# Patient Record
Sex: Male | Born: 1945 | Race: Black or African American | Hispanic: No | Smoking: Current some day smoker
Health system: Southern US, Community
[De-identification: ages and names within clinical notes are randomized; demographics above are authoritative.]

## PROBLEM LIST (undated history)

## (undated) DIAGNOSIS — I10 Essential (primary) hypertension: Secondary | ICD-10-CM

---

## 2015-12-21 ENCOUNTER — Emergency Department (HOSPITAL_COMMUNITY): Payer: Medicare Other

## 2015-12-21 ENCOUNTER — Encounter (HOSPITAL_COMMUNITY): Payer: Self-pay

## 2015-12-21 ENCOUNTER — Emergency Department (HOSPITAL_COMMUNITY)
Admission: EM | Admit: 2015-12-21 | Discharge: 2015-12-21 | Disposition: A | Discharge status: Home / Self Care | Payer: Medicare Other | Attending: Emergency Medicine | Admitting: Emergency Medicine

## 2015-12-21 DIAGNOSIS — I10 Essential (primary) hypertension: Secondary | ICD-10-CM

## 2015-12-21 DIAGNOSIS — F17200 Nicotine dependence, unspecified, uncomplicated: Secondary | ICD-10-CM | POA: Insufficient documentation

## 2015-12-21 DIAGNOSIS — R51 Headache: Secondary | ICD-10-CM | POA: Diagnosis not present

## 2015-12-21 DIAGNOSIS — R519 Headache, unspecified: Secondary | ICD-10-CM

## 2015-12-21 HISTORY — DX: Essential (primary) hypertension: I10

## 2015-12-21 LAB — BASIC METABOLIC PANEL
Anion gap: 14 (ref 5–15)
BUN: 29 mg/dL — ABNORMAL HIGH (ref 6–20)
CALCIUM: 9.9 mg/dL (ref 8.9–10.3)
CHLORIDE: 100 mmol/L — AB (ref 101–111)
CO2: 24 mmol/L (ref 22–32)
CREATININE: 1.48 mg/dL — AB (ref 0.61–1.24)
GFR calc non Af Amer: 46 mL/min — ABNORMAL LOW (ref >60)
GFR, EST AFRICAN AMERICAN: 53 mL/min — AB (ref >60)
Glucose, Bld: 99 mg/dL (ref 65–99)
Potassium: 4.6 mmol/L (ref 3.5–5.1)
SODIUM: 138 mmol/L (ref 135–145)

## 2015-12-21 LAB — CBC
HCT: 46.2 % (ref 39.0–52.0)
HEMOGLOBIN: 15.1 g/dL (ref 13.0–17.0)
MCH: 29.1 pg (ref 26.0–34.0)
MCHC: 32.7 g/dL (ref 30.0–36.0)
MCV: 89 fL (ref 78.0–100.0)
Platelets: 274 10*3/uL (ref 150–400)
RBC: 5.19 MIL/uL (ref 4.22–5.81)
RDW: 15.4 % (ref 11.5–15.5)
WBC: 7.3 10*3/uL (ref 4.0–10.5)

## 2015-12-21 MED ORDER — HYDROCHLOROTHIAZIDE 25 MG PO TABS: 25.0000 mg | ORAL_TABLET | Freq: Every day | ORAL | Status: DC

## 2015-12-21 MED ORDER — HYDROCHLOROTHIAZIDE 25 MG PO TABS: 25.0000 mg | ORAL_TABLET | Freq: Every day | ORAL | 0 refills | Status: AC

## 2015-12-21 MED ORDER — HYDROCHLOROTHIAZIDE 25 MG PO TABS
25.0000 mg | ORAL_TABLET | Freq: Once | ORAL | Status: AC
  Administered 2015-12-21: 25 mg via ORAL
  Filled 2015-12-21: qty 1

## 2015-12-21 NOTE — ED Triage Notes (Signed)
Pt here with headache, blurry vision and back pain, onset 3 days ago. Denies head injury, n/v. Denies hx of HAs. A&ox4, ambulatory,  NAD, strong equal hand grasps bilaterally. Denies numbness, tingling or weakness anywhere.

## 2015-12-21 NOTE — ED Provider Notes (Signed)
[facility]PT Provider Note   CSN: [id number] Arrival date & time: 12/21/15  1158     History   Chief Complaint Chief Complaint  Patient presents with  . Headache    HPI Max Cross is a 70 y.o. male.  HPI 70 y.o. male with a hx of HTN, presents to the Emergency Department today complaining of headache x 3 days ago. Noted that the headache occurring while sitting on the couch. States that the headache was circumferential and lasted for 2 days straight. Rated pain 6/10. No relief with a medication similar to Nyquil. Pt states he took the medication so he could sleep. No N/V. Does endorse blurred vision at the time that is somewhat improved today. Denies headache currently. No CP/SOB/ABD pain. Does have bilateral pain in trapezius. No trauma or lifting injury that he is aware. No fevers. No numbness/tingling down extremities. NO hx CVA. Only hx is HTN, which he did not take his BP meds today. He is on 25mg  HCTZ. No other symptoms noted.     Past Medical History:  Diagnosis Date  . Hypertension    There are no active problems to display for this patient.   History reviewed. No pertinent surgical history.   Home Medications    Prior to Admission medications   Not on File    Family History No family history on file.  Social History Social History  Substance Use Topics  . Smoking status: Current Some Day Smoker  . Smokeless tobacco: Never Used  . Alcohol use No     Allergies   Review of patient's allergies indicates not on file.   Review of Systems Review of Systems ROS reviewed and all are negative for acute change except as noted in the HPI.  Physical Exam Updated Vital Signs BP 157/90 (BP Location: Right Arm)   Pulse 97   Temp 98.2 F (36.8 C) (Oral)   Resp 16   SpO2 100%   Physical Exam  Constitutional: He is oriented to person, place, and time. Vital signs are normal. He appears well-developed and well-nourished.  HENT:  Head: Normocephalic  and atraumatic. Head is without raccoon's eyes and without Battle's sign.  Right Ear: Hearing normal.  Left Ear: Hearing normal.  Eyes: Conjunctivae and EOM are normal. Pupils are equal, round, and reactive to light.  Neck: Trachea normal, normal range of motion, full passive range of motion without pain and phonation normal. Neck supple. No tracheal tenderness, no spinous process tenderness and no muscular tenderness present. No neck rigidity. No edema, no erythema and normal range of motion present.  Cardiovascular: Normal rate, regular rhythm, normal heart sounds, intact distal pulses and normal pulses.   Distal pulses appreciated x 4 extremities  Pulmonary/Chest: Effort normal and breath sounds normal. No respiratory distress. He has no wheezes. He has no rales. He exhibits no tenderness.  Abdominal: Soft.  Musculoskeletal: Normal range of motion.  TTP bilateral trapezius musculature. ROM intact. No erythema. No ecchymosis.  Neurological: He is alert and oriented to person, place, and time. He has normal strength and normal reflexes. No cranial nerve deficit or sensory deficit.  Cranial Nerves:  II: Pupils equal, round, reactive to light III,IV, VI: ptosis not present, extra-ocular motions intact bilaterally  V,VII: smile symmetric, facial light touch sensation equal VIII: hearing grossly normal bilaterally  IX,X: midline uvula rise  XI: bilateral shoulder shrug equal and strong XII: midline tongue extension  Skin: Skin is warm and dry.  Psychiatric: He has a normal  mood and affect. His speech is normal and behavior is normal. Thought content normal.  Nursing note and vitals reviewed.  ED Treatments / Results  Labs (all labs ordered are listed, but only abnormal results are displayed) Labs Reviewed  BASIC METABOLIC PANEL - Abnormal; Notable for the following:       Result Value   Chloride 100 (*)    BUN 29 (*)    Creatinine, Ser 1.48 (*)    GFR calc non Af Amer 46 (*)    GFR  calc Af Amer 53 (*)    All other components within normal limits  CBC    EKG  EKG Interpretation None      Radiology Ct Head Wo Contrast  Result Date: 12/21/2015 CLINICAL DATA:  Headache for 2 weeks. EXAM: CT HEAD WITHOUT CONTRAST TECHNIQUE: Contiguous axial images were obtained from the base of the skull through the vertex without intravenous contrast. COMPARISON:  None. FINDINGS: Brain: There is no evidence for acute hemorrhage, hydrocephalus, mass lesion, or abnormal extra-axial fluid collection. No definite CT evidence for acute infarction. 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 Vascular: Atherosclerotic calcification is visualized in the carotid arteries. No dense MCA sign. Major dural sinuses are unremarkable. Skull: No evidence for skull fracture. Sinuses/Orbits: The visualized paranasal sinuses and mastoid air cells are clear. Visualized portions of the globes and intraorbital fat are unremarkable. Other: N/A IMPRESSION: No acute intracranial abnormality. Electronically Signed   By: Kennith Center M.D.   On: 12/21/2015 16:37    Procedures Procedures (including critical care time)  Medications Ordered in ED Medications - No data to display   Initial Impression / Assessment and Plan / ED Course  I have reviewed the triage vital signs and the nursing notes.  Pertinent labs & imaging results that were available during my care of the patient were reviewed by me and considered in my medical decision making (see chart for details).  Clinical Course   Final Clinical Impressions(s) / ED Diagnoses  I have reviewed and evaluated the relevant laboratory values I have reviewed and evaluated the relevant imaging studies. I have reviewed the relevant previous healthcare records.I obtained HPI from historian. Patient discussed with supervising physician  ED  Course:  Assessment: Pt is a 69yM with hx HTn who presents with headache x 2 days that has since resolved. Associated blurred vision, which is improving. No N/V. No numbness/tingling. No hx CVA. On exam, pt in NAD. Nontoxic/nonseptic appearing. VSS. Afebrile. Lungs CTA. Heart RRR. CN evaluated and unremarakble. CBC/BMP unremarkable. CT Head unremarkable. Possibly related to HTN. Given home BP meds in ED. Discussed with supervising physician. Plan is to DC Home with follow up to PCP. At time of discharge, Patient is in no acute distress. Vital Signs are stable. Patient is able to ambulate. Patient able to tolerate PO.   Refilled pt HCTZ medication as he ran out x 1 week ago.   Disposition/Plan:  DC Home Additional Verbal discharge instructions given and discussed with patient.  Pt Instructed to f/u with PCP in the next week for evaluation and treatment of symptoms. Return precautions given Pt acknowledges and agrees with plan  Supervising Physician Raeford Razor, MD   Final diagnoses:  Essential hypertension  Nonintractable headache, unspecified chronicity pattern, unspecified headache type    New Prescriptions New Prescriptions   No medications on file       Audry Pili, PA-C 12/21/15 1645  Raeford Razor, MD 12/21/15 [phone]

## 2015-12-21 NOTE — ED Notes (Signed)
Pt A&OX4, pt states his daughter will be driving him home.

## 2015-12-21 NOTE — Discharge Instructions (Signed)
Please read and follow all provided instructions.  Your diagnoses today include:  1. Essential hypertension   2. Nonintractable headache, unspecified chronicity pattern, unspecified headache type    Tests performed today include: Vital signs. See below for your results today.   Medications prescribed:  Take as prescribed   Home care instructions:  Follow any educational materials contained in this packet.  Follow-up instructions: Please follow-up with your primary care provider for further evaluation of symptoms and treatment   Return instructions:  Please return to the Emergency Department if you do not get better, if you get worse, or new symptoms OR  - Fever (temperature greater than 101.49F)  - Bleeding that does not stop with holding pressure to the area    -Severe pain (please note that you may be more sore the day after your accident)  - Chest Pain  - Difficulty breathing  - Severe nausea or vomiting  - Inability to tolerate food and liquids  - Passing out  - Skin becoming red around your wounds  - Change in mental status (confusion or lethargy)  - New numbness or weakness    Please return if you have any other emergent concerns.  Additional Information:  Your vital signs today were: BP 175/100 (BP Location: Right Arm)    Pulse 85    Temp 98.3 F (36.8 C) (Oral)    Resp 16    SpO2 100%  If your blood pressure (BP) was elevated above 135/85 this visit, please have this repeated by your doctor within one month. ---------------

## 2016-08-30 ENCOUNTER — Emergency Department (HOSPITAL_COMMUNITY)
Admission: EM | Admit: 2016-08-30 | Discharge: 2016-08-30 | Disposition: A | Discharge status: Home / Self Care | Payer: Medicare Other | Attending: Emergency Medicine | Admitting: Emergency Medicine

## 2016-08-30 ENCOUNTER — Encounter (HOSPITAL_COMMUNITY): Payer: Self-pay | Admitting: Emergency Medicine

## 2016-08-30 DIAGNOSIS — I10 Essential (primary) hypertension: Secondary | ICD-10-CM | POA: Insufficient documentation

## 2016-08-30 DIAGNOSIS — R21 Rash and other nonspecific skin eruption: Secondary | ICD-10-CM | POA: Diagnosis present

## 2016-08-30 DIAGNOSIS — F17200 Nicotine dependence, unspecified, uncomplicated: Secondary | ICD-10-CM | POA: Diagnosis not present

## 2016-08-30 MED ORDER — PREDNISONE 20 MG PO TABS: ORAL_TABLET | ORAL | 0 refills | Status: AC

## 2016-08-30 NOTE — ED Triage Notes (Signed)
Pt. Stated, I started having this rash on my arms that itches and burns. For over a week. . Pt. Take HCTZ

## 2016-08-30 NOTE — Discharge Instructions (Signed)
Please read and follow all provided instructions.  Your diagnoses today include:  1. Rash and nonspecific skin eruption     Tests performed today include:  Vital signs. See below for your results today.   Medications prescribed:   Prednisone - steroid medicine   It is best to take this medication in the morning to prevent sleeping problems. If you are diabetic, monitor your blood sugar closely and stop taking Prednisone if blood sugar is over 300. Take with food to prevent stomach upset.   Take any prescribed medications only as directed.  Home care instructions:   Follow any educational materials contained in this packet  Wear skin protection such as clothing or sun screen  Follow-up instructions: Please follow-up with your primary care provider in the next 7 days if rash is not improved.   Return instructions:   Please return to the Emergency Department if you experience worsening symptoms.   Call 9-1-1 immediately if you have an allergic reaction that involves your lips, mouth, throat or if you have any difficulty breathing. This is a life-threatening emergency.   Please return if you have any other emergent concerns.  Additional Information:  Your vital signs today were: BP (!) 164/103 (BP Location: Right Arm)    Pulse 78    Temp 98.4 F (36.9 C) (Oral)    Resp 16    Ht 5\' 6"  (1.676 m)    Wt 146 lb (66.2 kg)    SpO2 99%    BMI 23.57 kg/m  If your blood pressure (BP) was elevated above 135/85 this visit, please have this repeated by your doctor within one month. --------------

## 2016-08-30 NOTE — ED Provider Notes (Signed)
  Face-to-face evaluation   History: Complaints of rash for several days.  It itches.  It seems to be aggravated by being in the sun.  Physical exam: Alert, cooperative.  No respiratory distress.  Red raised rash, bilateral antecubital spaces, and dorsal forearms.  Nonspecific appearance.  Medical screening examination/treatment/procedure(s) were conducted as a shared visit with non-physician practitioner(s) and myself.  I personally evaluated the patient during the encounter   Mancel BaleWentz, Taden Witter, MD 08/30/16 [phone]

## 2016-08-30 NOTE — ED Provider Notes (Signed)
[facility] Provider Note   CSN: [id number] Arrival date & time: 08/30/16  0715     History   Chief Complaint Chief Complaint  Patient presents with  . Rash    HPI Max Cross is a 71 y.o. male.  Patient with history of hypertension on HCTZ -- presents with complaint of itchy rash that was originally around his neckline and also his bilateral forearms. Denies other areas. No lesions in the mouth or of the eyes. No difficulty breathing, lightheadedness, vomiting. Patient applied "a cream" to the arms which did not help. The rash around his neck is improved. Patient reports going outside but not for prolonged periods of time. He denies any new food or medication exposures. Denies other new skin exposures. No tick bites or exposures to poisonous plants. He does report discontinuing his HCTZ for several days after the rash began. The onset of this condition was acute. The course is constant. Aggravating factors: none. Alleviating factors: none.        Past Medical History:  Diagnosis Date  . Hypertension     There are no active problems to display for this patient.   History reviewed. No pertinent surgical history.     Home Medications    Prior to Admission medications   Medication Sig Start Date End Date Taking? Authorizing Provider  hydrochlorothiazide (HYDRODIURIL) 25 MG tablet Take 1 tablet (25 mg total) by mouth daily. 12/21/15   Audry Pili, PA-C    Family History No family history on file.  Social History Social History  Substance Use Topics  . Smoking status: Current Some Day Smoker  . Smokeless tobacco: Never Used  . Alcohol use No     Allergies   Patient has no allergy information on record.   Review of Systems Review of Systems  Constitutional: Negative for fever.  HENT: Negative for facial swelling and trouble swallowing.   Eyes: Negative for pain, discharge, redness and itching.  Respiratory: Negative for shortness of breath,  wheezing and stridor.   Cardiovascular: Negative for chest pain.  Gastrointestinal: Negative for nausea and vomiting.  Musculoskeletal: Negative for myalgias.  Skin: Positive for rash.  Neurological: Negative for light-headedness.  Psychiatric/Behavioral: Negative for confusion.     Physical Exam Updated Vital Signs BP (!) 187/111 (BP Location: Right Arm)   Pulse (!) 122   Temp 99.2 F (37.3 C) (Oral)   Resp 16   Ht 5\' 6"  (1.676 m)   Wt 146 lb (66.2 kg)   SpO2 99%   BMI 23.57 kg/m   Physical Exam  Constitutional: He appears well-developed and well-nourished.  HENT:  Head: Normocephalic and atraumatic.  No intraoral lesions.  Eyes: Conjunctivae are normal. Right eye exhibits no discharge. Left eye exhibits no discharge.  No conjunctival erythema.  Neck: Normal range of motion. Neck supple.  Cardiovascular: Normal rate, regular rhythm and normal heart sounds.   Pulmonary/Chest: Effort normal and breath sounds normal.  Abdominal: Soft. There is no tenderness.  Neurological: He is alert.  Skin: Skin is warm and dry. There is erythema.  Nonspecific maculopapular rash noted to the bilateral forearms. Patient reports resolved rash around the neckline.  Psychiatric: He has a normal mood and affect.  Nursing note and vitals reviewed.    ED Treatments / Results  Labs (all labs ordered are listed, but only abnormal results are displayed) Labs Reviewed - No data to display  EKG  EKG Interpretation None       Radiology No results found.  Procedures Procedures (including critical care time)  Medications Ordered in ED Medications - No data to display   Initial Impression / Assessment and Plan / ED Course  I have reviewed the triage vital signs and the nursing notes.  Pertinent labs & imaging results that were available during my care of the patient were reviewed by me and considered in my medical decision making (see chart for details).     Patient seen and  examined. D/w and seen by Dr. Effie ShyWentz.   Vital signs reviewed and are as follows: BP (!) 187/111 (BP Location: Right Arm)   Pulse (!) 122   Temp 99.2 F (37.3 C) (Oral)   Resp 16   Ht 5\' 6"  (1.676 m)   Wt 146 lb (66.2 kg)   SpO2 99%   BMI 23.57 kg/m   Repeat vitals improved.   BP (!) 164/103 (BP Location: Right Arm)   Pulse 78   Temp 98.4 F (36.9 C) (Oral)   Resp 16   Ht 5\' 6"  (1.676 m)   Wt 146 lb (66.2 kg)   SpO2 99%   BMI 23.57 kg/m   Will d/c to home with prednisone taper. Discussed skin protection from the sun. Encouraged return if symptoms worsen, PCP follow-up if not improving.  Final Clinical Impressions(s) / ED Diagnoses   Final diagnoses:  Rash and nonspecific skin eruption   Patient with rash, maculopapular, itchy, noted to sun exposed areas. Patient is on HCTZ which may make him photosensitive. Also potentially contact dermatitis of some sort. No contraindications to course of tapered steroids. No signs of anaphylaxis or other systemic symptoms. No eye or mucosal involvement.  New Prescriptions New Prescriptions   PREDNISONE (DELTASONE) 20 MG TABLET    3 Tabs PO Days 1-3, then 2 tabs PO Days 4-6, then 1 tab PO Day 7-9, then Half Tab PO Day 10-12     Renne CriglerGeiple, Gurnie Duris, PA-C 08/30/16 0800    Mancel BaleWentz, Elliott, MD 08/30/16 [phone]

## 2016-08-30 NOTE — ED Notes (Signed)
Declined W/C at D/C and was escorted to lobby by RN. 

## 2016-09-20 ENCOUNTER — Encounter (HOSPITAL_COMMUNITY): Payer: Self-pay | Admitting: Emergency Medicine

## 2016-09-20 ENCOUNTER — Emergency Department (HOSPITAL_COMMUNITY)
Admission: EM | Admit: 2016-09-20 | Discharge: 2016-09-20 | Disposition: A | Discharge status: Home / Self Care | Payer: Medicare Other | Attending: Emergency Medicine | Admitting: Emergency Medicine

## 2016-09-20 DIAGNOSIS — R21 Rash and other nonspecific skin eruption: Secondary | ICD-10-CM | POA: Diagnosis not present

## 2016-09-20 DIAGNOSIS — I10 Essential (primary) hypertension: Secondary | ICD-10-CM | POA: Diagnosis not present

## 2016-09-20 DIAGNOSIS — F17200 Nicotine dependence, unspecified, uncomplicated: Secondary | ICD-10-CM | POA: Insufficient documentation

## 2016-09-20 MED ORDER — PREDNISONE 10 MG (21) PO TBPK: ORAL_TABLET | Freq: Every day | ORAL | 0 refills | Status: AC

## 2016-09-20 MED ORDER — PREDNISONE 20 MG PO TABS
60.0000 mg | ORAL_TABLET | Freq: Once | ORAL | Status: AC
  Administered 2016-09-20: 60 mg via ORAL
  Filled 2016-09-20: qty 3

## 2016-09-20 NOTE — Discharge Instructions (Signed)
Call your doctor tomorrow to schedule a follow-up visit 

## 2016-09-20 NOTE — ED Provider Notes (Addendum)
[facility] Provider Note   CSN: [id number] Arrival date & time: 09/20/16  0907     History   Chief Complaint Chief Complaint  Patient presents with  . Rash  . Nausea    HPI [name] is a 71 y.o. male.  71 year old male presents with rash to both arms. He denies any chemical exposures. Had been on prednisone for similar symptoms which improved his symptoms don't see complete the course medications the rash returned. Rash is pruritic hive-like. Denies any oral involvement. No dyspnea. No trouble swallowing. Has been taking antihistamine which hasn't helped.      Past Medical History:  Diagnosis Date  . Hypertension     There are no active problems to display for this patient.   History reviewed. No pertinent surgical history.     Home Medications    Prior to Admission medications   Medication Sig Start Date End Date Taking? Authorizing Provider  hydrochlorothiazide (HYDRODIURIL) 25 MG tablet Take 1 tablet (25 mg total) by mouth daily. 12/21/15   Audry Pili, PA-C  predniSONE (DELTASONE) 20 MG tablet 3 Tabs PO Days 1-3, then 2 tabs PO Days 4-6, then 1 tab PO Day 7-9, then Half Tab PO Day 10-12 08/30/16   Renne Crigler, PA-C    Family History History reviewed. No pertinent family history.  Social History Social History  Substance Use Topics  . Smoking status: Current Some Day Smoker  . Smokeless tobacco: Never Used  . Alcohol use No     Allergies   Patient has no known allergies.   Review of Systems Review of Systems  All other systems reviewed and are negative.    Physical Exam Updated Vital Signs BP (!) 176/98   Pulse 94   Temp 98.4 F (36.9 C) (Oral)   Resp 17   SpO2 100%   Physical Exam  Constitutional: He is oriented to person, place, and time. He appears well-developed and well-nourished.  Non-toxic appearance. No distress.  HENT:  Head: Normocephalic and atraumatic.  Eyes: Conjunctivae, EOM and lids are normal. Pupils  are equal, round, and reactive to light.  Neck: Normal range of motion. Neck supple. No tracheal deviation present. No thyroid mass present.  Cardiovascular: Normal rate, regular rhythm and normal heart sounds.  Exam reveals no gallop.   No murmur heard. Pulmonary/Chest: Effort normal and breath sounds normal. No stridor. No respiratory distress. He has no decreased breath sounds. He has no wheezes. He has no rhonchi. He has no rales.  Abdominal: Soft. Normal appearance and bowel sounds are normal. He exhibits no distension. There is no tenderness. There is no rebound and no CVA tenderness.  Musculoskeletal: Normal range of motion. He exhibits no edema or tenderness.  Neurological: He is alert and oriented to person, place, and time. He has normal strength. No cranial nerve deficit or sensory deficit. GCS eye subscore is 4. GCS verbal subscore is 5. GCS motor subscore is 6.  Skin: Skin is warm and dry. Rash noted. Rash is urticarial.  Psychiatric: He has a normal mood and affect. His speech is normal and behavior is normal.  Nursing note and vitals reviewed.    ED Treatments / Results  Labs (all labs ordered are listed, but only abnormal results are displayed) Labs Reviewed - No data to display  EKG  EKG Interpretation None       Radiology No results found.  Procedures Procedures (including critical care time)  Medications Ordered in ED Medications  predniSONE (DELTASONE) tablet  60 mg (not administered)     Initial Impression / Assessment and Plan / ED Course  I have reviewed the triage vital signs and the nursing notes.  Pertinent labs & imaging results that were available during my care of the patient were reviewed by me and considered in my medical decision making (see chart for details).     Patient be treated with prednisone here. Will be given prednisone taper and instructed to use Benadryl as needed. He'll follow this Dr. next week for a referral to an  allergist  Final Clinical Impressions(s) / ED Diagnoses   Final diagnoses:  None    New Prescriptions New Prescriptions   No medications on file     Lorre NickAllen, Sesar Madewell, MD 09/20/16 1010    Lorre NickAllen, Jelina Paulsen, MD 09/20/16 1010

## 2016-09-20 NOTE — ED Triage Notes (Signed)
Pt here for itchy rash to arms and nausea x 5 days; pt sts not able to eat and having generalized weakness

## 2016-09-20 NOTE — ED Notes (Signed)
Pt has red raised type rash on both arms-- took steroids and was better-- [facility] on C[facility], rash came back.

## 2017-01-07 ENCOUNTER — Encounter (HOSPITAL_COMMUNITY): Payer: Self-pay

## 2017-01-07 ENCOUNTER — Emergency Department (HOSPITAL_COMMUNITY)
Admission: EM | Admit: 2017-01-07 | Discharge: 2017-01-07 | Disposition: A | Discharge status: Home / Self Care | Payer: Medicare Other | Attending: Emergency Medicine | Admitting: Emergency Medicine

## 2017-01-07 DIAGNOSIS — B354 Tinea corporis: Secondary | ICD-10-CM

## 2017-01-07 DIAGNOSIS — F17210 Nicotine dependence, cigarettes, uncomplicated: Secondary | ICD-10-CM | POA: Insufficient documentation

## 2017-01-07 DIAGNOSIS — Z79899 Other long term (current) drug therapy: Secondary | ICD-10-CM | POA: Diagnosis not present

## 2017-01-07 DIAGNOSIS — I10 Essential (primary) hypertension: Secondary | ICD-10-CM | POA: Diagnosis not present

## 2017-01-07 DIAGNOSIS — R21 Rash and other nonspecific skin eruption: Secondary | ICD-10-CM

## 2017-01-07 MED ORDER — HYDROXYZINE HCL 25 MG PO TABS: 50.0000 mg | ORAL_TABLET | Freq: Four times a day (QID) | ORAL | 0 refills | Status: AC | PRN

## 2017-01-07 MED ORDER — KETOCONAZOLE 2 % EX CREA: 1.0000 "application " | TOPICAL_CREAM | Freq: Every day | CUTANEOUS | 1 refills | Status: AC

## 2017-01-07 NOTE — ED Notes (Signed)
See PCP secondary assessment.

## 2017-01-07 NOTE — ED Triage Notes (Signed)
Pt presents to the ed for a rash x 3-4 months that is on his arms and legs, rash is itchy and red. States he has been seen for it multiple times and was told he needs a biopsy but never heard back and the itching is unbearable.
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
## 2017-03-14 IMAGING — CT CT HEAD W/O CM
4 series · 16 of 47 positions shown, 18 images · non-contrast
Comparison: None.

CLINICAL DATA: Headache for 2 weeks.

EXAM:
CT HEAD WITHOUT CONTRAST
TECHNIQUE: Contiguous axial images were obtained from the base of the skull
through the vertex without intravenous contrast.

[Series 2: head without · axial · non-contrast · 0.43mm/px · z∈[-57,+58]mm · 7 of 31 slices shown, 9 images]
[im 4/31  brain]
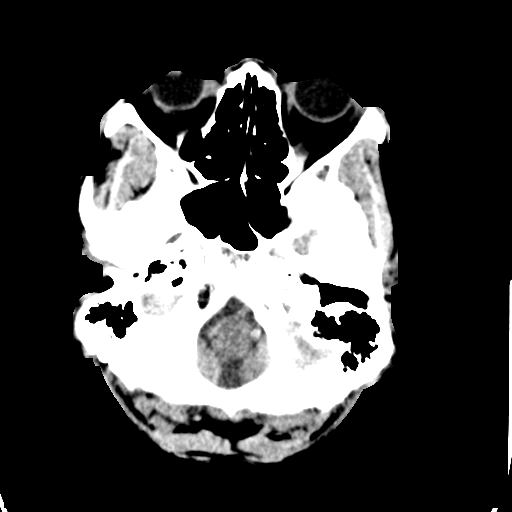
[im 4/31  bone]
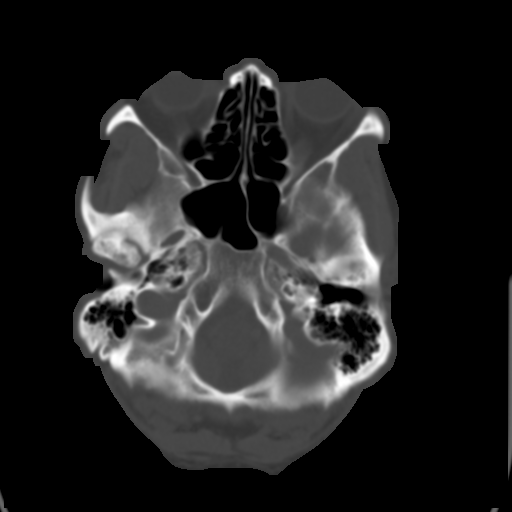
[im 8/31  brain]
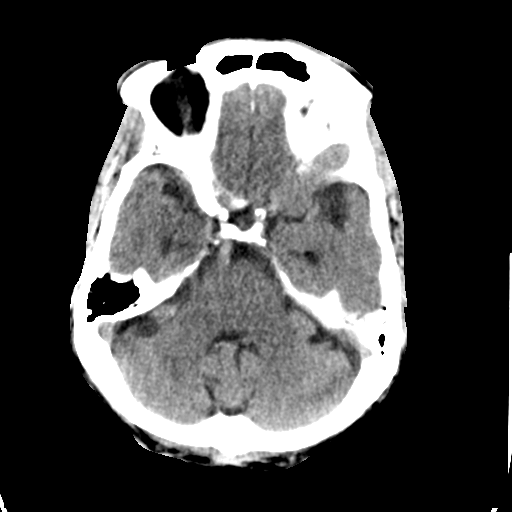
[im 12/31  brain]
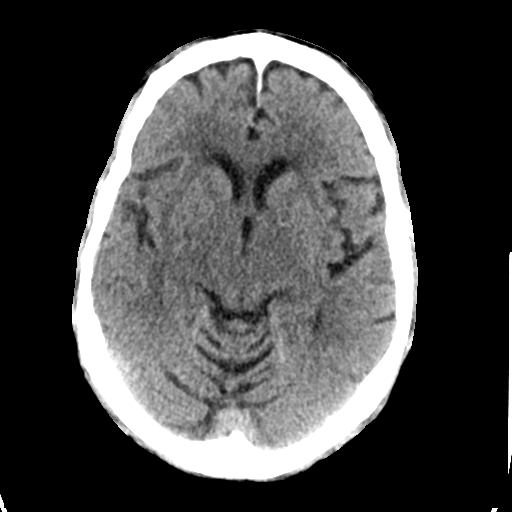
[im 16/31  brain]
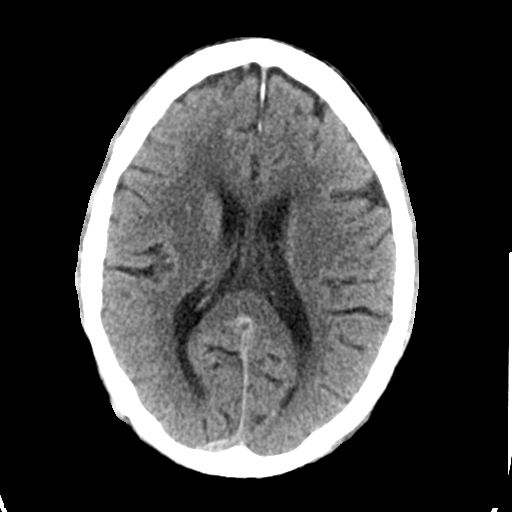
[im 19/31  brain]
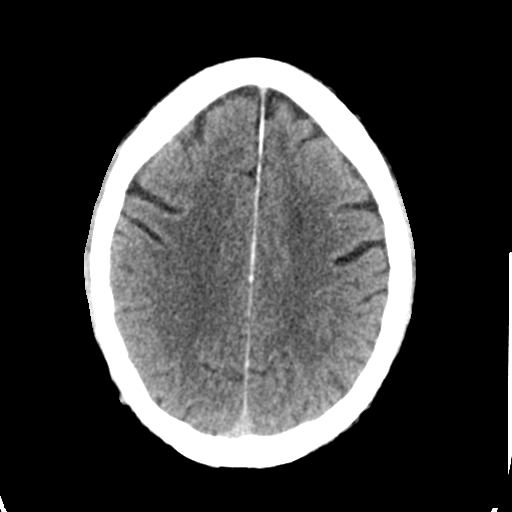
[im 19/31  bone]
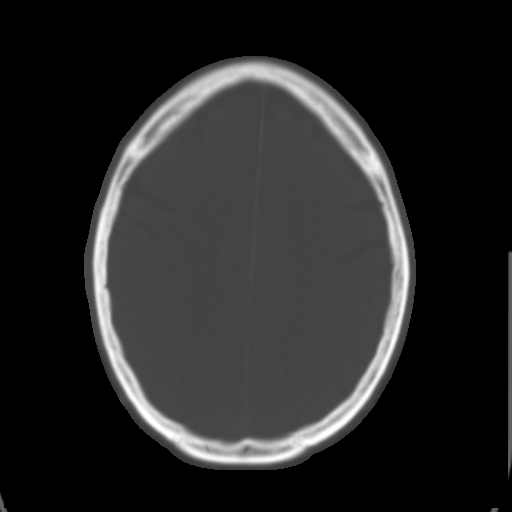
[im 23/31  brain]
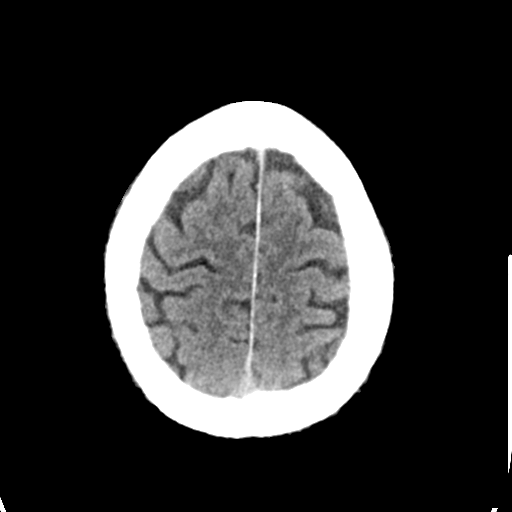
[im 27/31  brain]
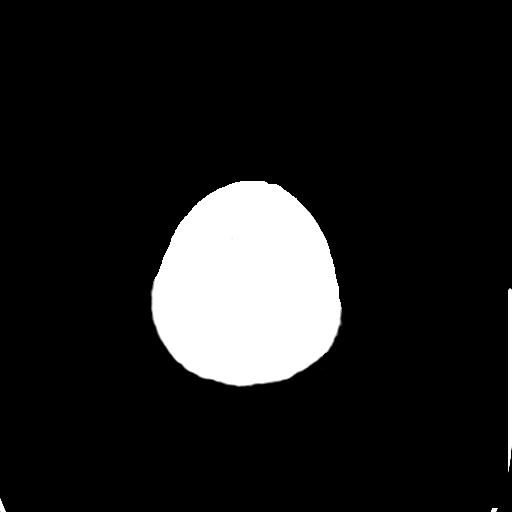

[Series 3: head bone · axial · 0.43mm/px · z∈[-58,-28]mm · 3 of 76 slices shown]
[im 8/76  bone]
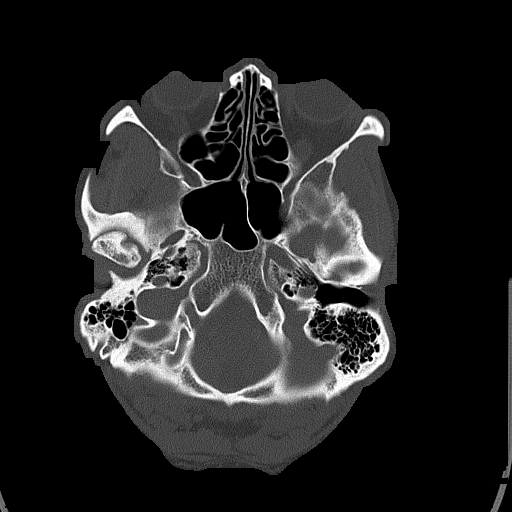
[im 16/76  bone]
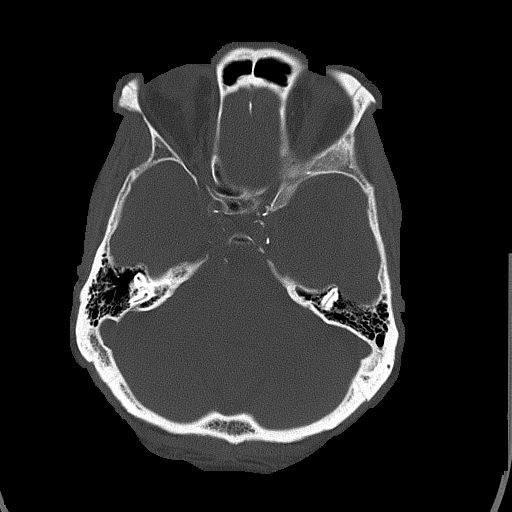
[im 23/76  bone]
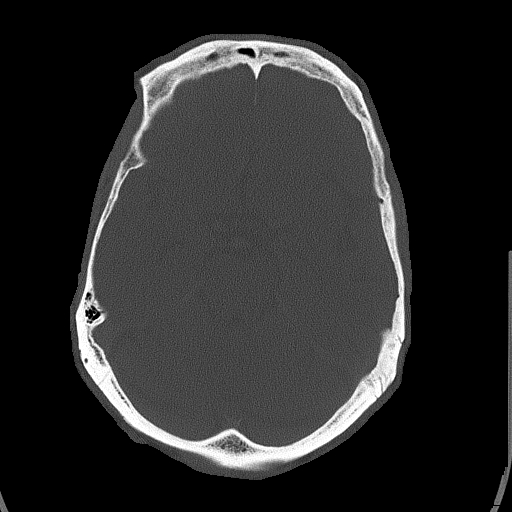

[Series 4: head without cor · coronal · non-contrast · 0.29mm/px · 3 of 67 slices shown]
[im 23/67  brain]
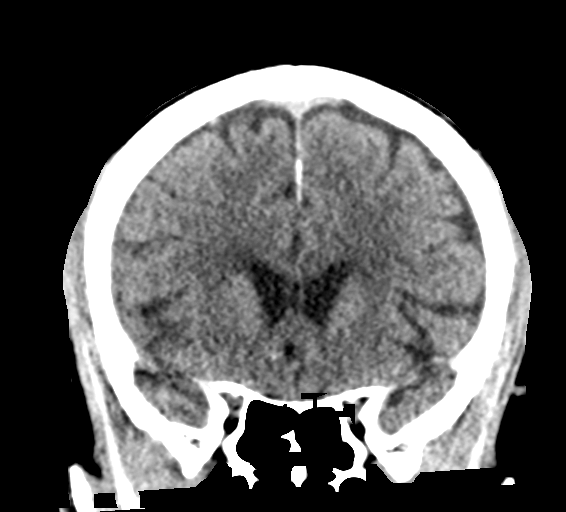
[im 30/67  brain]
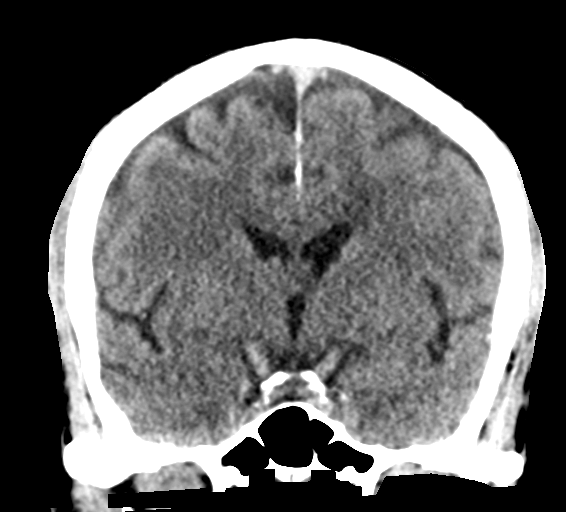
[im 37/67  brain]
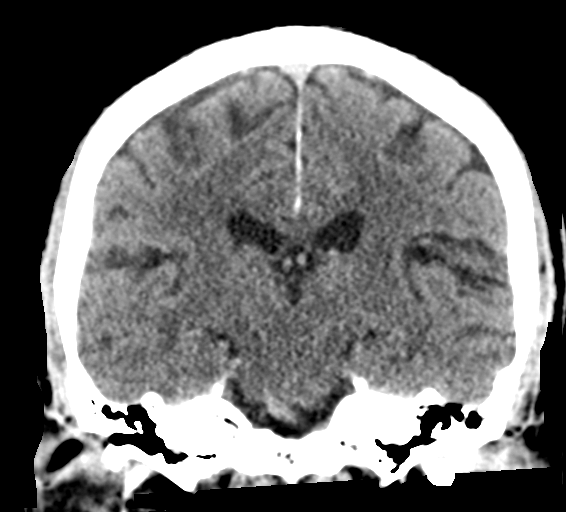

[Series 5: head without sag · sagittal · non-contrast · 0.29mm/px · 3 of 51 slices shown]
[im 17/51  brain]
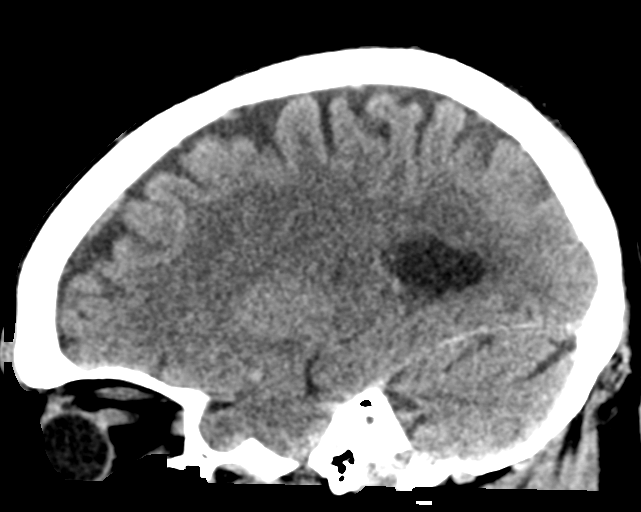
[im 26/51  brain]
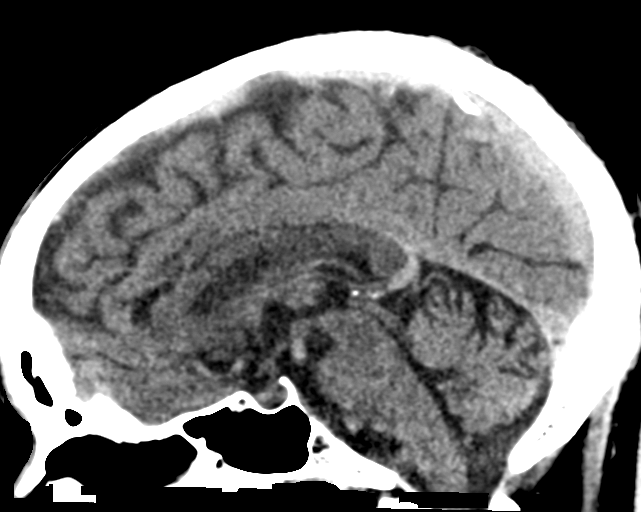
[im 34/51  brain]
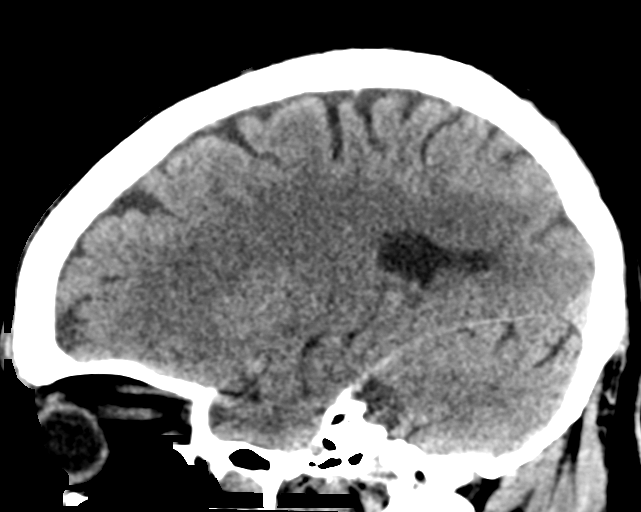

[16 of 47 positions shown; findings below may reference images not displayed]

FINDINGS: Brain: There is no evidence for acute hemorrhage, hydrocephalus,
mass lesion, or abnormal extra-axial fluid collection. No definite
CT evidence for acute infarction. Patchy low attenuation in the deep
hemispheric and periventricular white matter is nonspecific, but
likely reflects chronic microvascular ischemic demyelination.

Vascular: Atherosclerotic calcification is visualized in the carotid
arteries. No dense MCA sign. Major dural sinuses are unremarkable.

Skull: No evidence for skull fracture.

Sinuses/Orbits: The visualized paranasal sinuses and mastoid air
cells are clear. Visualized portions of the globes and intraorbital
fat are unremarkable.

Other: N/A
IMPRESSION: No acute intracranial abnormality.

## 2017-09-01 ENCOUNTER — Emergency Department (HOSPITAL_COMMUNITY): Payer: Medicare Other

## 2017-09-01 ENCOUNTER — Emergency Department (HOSPITAL_COMMUNITY)
Admission: EM | Admit: 2017-09-01 | Discharge: 2017-09-01 | Disposition: A | Discharge status: Home / Self Care | Payer: Medicare Other | Attending: Emergency Medicine | Admitting: Emergency Medicine

## 2017-09-01 ENCOUNTER — Encounter (HOSPITAL_COMMUNITY): Payer: Self-pay | Admitting: Emergency Medicine

## 2017-09-01 ENCOUNTER — Other Ambulatory Visit: Payer: Self-pay

## 2017-09-01 DIAGNOSIS — I10 Essential (primary) hypertension: Secondary | ICD-10-CM | POA: Insufficient documentation

## 2017-09-01 DIAGNOSIS — I491 Atrial premature depolarization: Secondary | ICD-10-CM | POA: Diagnosis not present

## 2017-09-01 DIAGNOSIS — R42 Dizziness and giddiness: Secondary | ICD-10-CM | POA: Diagnosis not present

## 2017-09-01 DIAGNOSIS — J029 Acute pharyngitis, unspecified: Secondary | ICD-10-CM | POA: Insufficient documentation

## 2017-09-01 DIAGNOSIS — F17210 Nicotine dependence, cigarettes, uncomplicated: Secondary | ICD-10-CM | POA: Insufficient documentation

## 2017-09-01 DIAGNOSIS — Z79899 Other long term (current) drug therapy: Secondary | ICD-10-CM | POA: Insufficient documentation

## 2017-09-01 LAB — CBG MONITORING, ED: Glucose-Capillary: 115 mg/dL — ABNORMAL HIGH (ref 65–99)

## 2017-09-01 LAB — I-STAT TROPONIN, ED: Troponin i, poc: 0.03 ng/mL (ref 0.00–0.08)

## 2017-09-01 LAB — CBC
HCT: 34.9 % — ABNORMAL LOW (ref 39.0–52.0)
HEMOGLOBIN: 10.7 g/dL — AB (ref 13.0–17.0)
MCH: 22.1 pg — ABNORMAL LOW (ref 26.0–34.0)
MCHC: 30.7 g/dL (ref 30.0–36.0)
MCV: 72.1 fL — ABNORMAL LOW (ref 78.0–100.0)
Platelets: 433 10*3/uL — ABNORMAL HIGH (ref 150–400)
RBC: 4.84 MIL/uL (ref 4.22–5.81)
RDW: 17.1 % — ABNORMAL HIGH (ref 11.5–15.5)
WBC: 5.5 10*3/uL (ref 4.0–10.5)

## 2017-09-01 LAB — BASIC METABOLIC PANEL
ANION GAP: 8 (ref 5–15)
BUN: 16 mg/dL (ref 6–20)
CALCIUM: 9.1 mg/dL (ref 8.9–10.3)
CO2: 28 mmol/L (ref 22–32)
Chloride: 102 mmol/L (ref 101–111)
Creatinine, Ser: 1.24 mg/dL (ref 0.61–1.24)
GFR, EST NON AFRICAN AMERICAN: 56 mL/min — AB (ref >60)
GLUCOSE: 120 mg/dL — AB (ref 65–99)
Potassium: 3.7 mmol/L (ref 3.5–5.1)
Sodium: 138 mmol/L (ref 135–145)

## 2017-09-01 LAB — URINALYSIS, ROUTINE W REFLEX MICROSCOPIC
Bacteria, UA: NONE SEEN
GLUCOSE, UA: NEGATIVE mg/dL
Hgb urine dipstick: NEGATIVE
KETONES UR: 5 mg/dL — AB
NITRITE: NEGATIVE
PH: 5 (ref 5.0–8.0)
Protein, ur: 100 mg/dL — AB
SPECIFIC GRAVITY, URINE: 1.026 (ref 1.005–1.030)

## 2017-09-01 LAB — GROUP A STREP BY PCR: Group A Strep by PCR: NOT DETECTED

## 2017-09-01 MED ORDER — DEXAMETHASONE SODIUM PHOSPHATE 10 MG/ML IJ SOLN
10.0000 mg | Freq: Once | INTRAMUSCULAR | Status: AC
  Administered 2017-09-01: 10 mg via INTRAMUSCULAR
  Filled 2017-09-01: qty 1

## 2017-09-01 MED ORDER — LIDOCAINE VISCOUS HCL 2 % MT SOLN
15.0000 mL | Freq: Once | OROMUCOSAL | Status: AC
  Administered 2017-09-01: 15 mL via OROMUCOSAL
  Filled 2017-09-01: qty 15

## 2017-09-01 MED ORDER — LIDOCAINE VISCOUS HCL 2 % MT SOLN: 15.0000 mL | OROMUCOSAL | 0 refills | Status: AC | PRN

## 2017-09-01 NOTE — ED Notes (Signed)
PT given cranberry juice to drink. Tolerating well

## 2017-09-01 NOTE — ED Triage Notes (Addendum)
Pt states he started taking Atorvastatin several days ago and since he has been taking it he has had a sore throat, feeling a lump in his throat with difficulty breathing at times accompanied with dizziness. Pt has not taken it since last Wednesday but is still dizzy and his throat is still sore, with a cough. Pt states he still has welts to his neck and upper body, legs and waist.

## 2017-09-01 NOTE — ED Provider Notes (Signed)
[facility] EMERGENCY DEPARTMENT Provider Note   CSN: [id number] Arrival date & time: 09/01/17  2130     History   Chief Complaint Chief Complaint  Patient presents with  . Allergic Reaction  . Sore Throat    HPI Max Cross is a 72 y.o. male with history of hypertension and anemia here for evaluation of sore throat and dizziness for 1 week.  Sore throat described as achy, sudden onset but gradually worsening x 1 week. Throat feels swollen. Pain is in bilateral sides equally. Aggravating factors: swallowing, talking, breathing in because air feels cold, mildly productive cough. Has tried cough drops without relief. No alleviating factors. Has been eating and drinking less due to pain. No sick contacts. No changes in voice, trismus, drooling. Started taking atorvastatin recently and feels sxs may be an allergic reaction to this medicine. No rash.   Dizziness onset 1 week ago along with ST. States he gets dizziness similar to this when he is sick. Has been eating and drinking less due to sore throat but feels he is urinating more frequently. Describes dizziness as "my vision gets foggy". Dizziness precipitated by standing up, movement and walking around.  Has to sit down and give himself a few minutes and dizziness eventually goes away. Dizziness last a few minutes. Associated with generalized fatigue. Denies associated HA, nausea, diaphoresis, CP, SOB, palpitations. Has not been vomiting or having diarrhea. Denies dysuria.  HPI  Past Medical History:  Diagnosis Date  . Hypertension     There are no active problems to display for this patient.   No past surgical history on file.      Home Medications    Prior to Admission medications   Medication Sig Start Date End Date Taking? Authorizing Provider  hydrochlorothiazide (HYDRODIURIL) 25 MG tablet Take 1 tablet (25 mg total) by mouth daily. 12/21/15   Audry Pili, PA-C  hydrOXYzine (ATARAX/VISTARIL) 25 MG  tablet Take 2 tablets (50 mg total) by mouth every 6 (six) hours as needed. 01/07/17   Arby Barrette, MD  predniSONE (DELTASONE) 20 MG tablet 3 Tabs PO Days 1-3, then 2 tabs PO Days 4-6, then 1 tab PO Day 7-9, then Half Tab PO Day 10-12 08/30/16   Renne Crigler, PA-C  predniSONE (STERAPRED UNI-PAK 21 TAB) 10 MG (21) TBPK tablet Take by mouth daily. Take 6 tabs by mouth daily  for 2 days, then 5 tabs for 2 days, then 4 tabs for 2 days, then 3 tabs for 2 days, 2 tabs for 2 days, then 1 tab by mouth daily for 2 days 09/20/16   Lorre Nick, MD    Family History No family history on file.  Social History Social History   Tobacco Use  . Smoking status: Current Some Day Smoker  . Smokeless tobacco: Never Used  Substance Use Topics  . Alcohol use: No  . Drug use: Yes    Types: Marijuana     Allergies   Atorvastatin   Review of Systems Review of Systems  Constitutional: Positive for appetite change.  HENT: Positive for sore throat.   Respiratory: Positive for cough.   Genitourinary:       Increased urination  Neurological: Positive for dizziness.  All other systems reviewed and are negative.    Physical Exam Updated Vital Signs BP (!) 157/81   Pulse 73   Temp 98.2 F (36.8 C)   Resp 19   SpO2 99%   Physical Exam  Constitutional: He is oriented  to person, place, and time. He appears well-developed and well-nourished. No distress.  Non toxic.  HENT:  Head: Normocephalic and atraumatic.  Nose: Nose normal.  Mouth/Throat: Tonsils are 1+ on the right. Tonsils are 1+ on the left. Tonsillar exudate (left).  Mild erythema to oropharynx and tonsils. Exudate to left tonsil. No significant edema. Uvula midline. No trismus. No SL edema or tenderness. No stridor or drooling. Moist mucous membranes. No lip, tongue or facial angioedema.   Eyes: Pupils are equal, round, and reactive to light. Conjunctivae and EOM are normal.  Neck: Normal range of motion.  No anterior neck edema    Cardiovascular: Normal rate, regular rhythm, normal heart sounds and intact distal pulses.  No murmur heard. 2+ DP and radial pulses bilaterally. No LE edema.   Pulmonary/Chest: Effort normal and breath sounds normal.  Abdominal: Soft. Bowel sounds are normal. There is no tenderness.  No G/R/R. No suprapubic or CVA tenderness. Negative Murphy's and McBurney's  Musculoskeletal: Normal range of motion.  Neurological: He is alert and oriented to person, place, and time.  CNs intact. 5/5 strength with hand grip and ankle F/E. Moving all four extremities in coordinated fashion.   Skin: Skin is warm and dry. Capillary refill takes less than 2 seconds.  Psychiatric: He has a normal mood and affect. His behavior is normal. Judgment and thought content normal.  Nursing note and vitals reviewed.    ED Treatments / Results  Labs (all labs ordered are listed, but only abnormal results are displayed) Labs Reviewed  BASIC METABOLIC PANEL - Abnormal; Notable for the following components:      Result Value   Glucose, Bld 120 (*)    GFR calc non Af Amer 56 (*)    All other components within normal limits  CBC - Abnormal; Notable for the following components:   Hemoglobin 10.7 (*)    HCT 34.9 (*)    MCV 72.1 (*)    MCH 22.1 (*)    RDW 17.1 (*)    Platelets 433 (*)    All other components within normal limits  URINALYSIS, ROUTINE W REFLEX MICROSCOPIC - Abnormal; Notable for the following components:   Color, Urine AMBER (*)    APPearance HAZY (*)    Bilirubin Urine SMALL (*)    Ketones, ur 5 (*)    Protein, ur 100 (*)    Leukocytes, UA TRACE (*)    All other components within normal limits  CBG MONITORING, ED - Abnormal; Notable for the following components:   Glucose-Capillary 115 (*)    All other components within normal limits  GROUP A STREP BY PCR  I-STAT TROPONIN, ED    EKG EKG Interpretation  Date/Time:  Wednesday Sep 01 2017 08:28:09 EDT Ventricular Rate:  89 PR Interval:     QRS Duration: 85 QT Interval:  474 QTC Calculation: 577 R Axis:   -40 Text Interpretation:  Sinus rhythm Atrial premature complex Probable left atrial enlargement Left ventricular hypertrophy Anterior Q waves, possibly due to LVH Nonspecific T abnormalities, lateral leads Prolonged QT interval No significant change since last tracing Abnormal ekg Confirmed by Gerhard Munch [id number]) on 09/01/2017 8:49:56 AM   Radiology Dg Chest 2 View  Result Date: 09/01/2017 CLINICAL DATA:  Cough and chest pain for 1 week EXAM: CHEST - 2 VIEW COMPARISON:  None. FINDINGS: Cardiac shadow is within normal limits. The lungs are well aerated bilaterally. No focal infiltrate or effusion is seen. No acute bony abnormality is noted. IMPRESSION: No  active cardiopulmonary disease. Electronically Signed   By: Alcide Clever M.D.   On: 09/01/2017 07:58    Procedures Procedures (including critical care time)  Medications Ordered in ED Medications  dexamethasone (DECADRON) injection 10 mg (has no administration in time range)  lidocaine (XYLOCAINE) 2 % viscous mouth solution 15 mL (15 mLs Mouth/Throat Given 09/01/17 0829)     Initial Impression / Assessment and Plan / ED Course  I have reviewed the triage vital signs and the nursing notes.  Pertinent labs & imaging results that were available during my care of the patient were reviewed by me and considered in my medical decision making (see chart for details).  Clinical Course as of Sep 01 912  Wed Sep 01, 2017  0834 Hemoglobin(!): 10.7 [CG]  1610 Sinus rhythm Atrial premature complex Probable left atrial enlargement LVH with secondary repolarization abnormality Probable anterior infarct, age indeterminate Prolonged QT interval Baseline wander in lead(s) V4 V6 Left axis deviation Abnormal ekg  ED EKG [CG]  0908 Leukocytes, UA(!): TRACE [CG]  0908 WBC, UA: 11-20 [CG]  0908 Bacteria, UA: NONE SEEN [CG]  0908 Ketones, ur(!): 5 [CG]    Clinical Course  User Index [CG] Liberty Handy, PA-C   72 yo M here with ST and light-headedness and fatigue. ST and cough likely viral. Ddx of light-headedness in this pt includes cardiac, orthostasis, occult infection.  Does not sound vertiginous and neuro exam grossly norma, CNS cause less likely. No significant signs of dehydration on exam, he is tolerating PO will hydrate orally. Pending labs and imaging.   9604: Labs and imaging reviewed remarkable for hemoglobin 10.7, patient with history of anemia no recent hemoglobin to compare.  Mild ketonuria, urine without convincing signs of infection, given urinary frequency will send for culture.  Defer antibiotics at this time.  EKG shows APCs, prolonged QT interval.  Chest x-ray negative.  Patient has been tolerating p.o. in the ER.  Ambulatory without difficulty, dizziness.  Rapid strep negative.  Will discharge with conservative management of viral pharyngitis.  Lightheadedness may be secondary to skipped beats on EKG.  Follow-up with PCP for referral to cardiology for this.  Strict return precautions.  Patient shared with Dr. Jeraldine Loots.  Final Clinical Impressions(s) / ED Diagnoses   Final diagnoses:  Viral pharyngitis  Light headedness  Run of atrial premature complexes    ED Discharge Orders    None       Jerrell Mylar 09/01/17 0914    Gerhard Munch, MD 09/01/17 [phone]

## 2017-09-01 NOTE — Discharge Instructions (Addendum)
Sore throat is from a viral infection.  Take 500 to 1000 mg of acetaminophen (Tylenol) every 6-8 hours to help with inflammation and pain.  Use lidocaine solution 5 to 10 minutes before eating or drinking to help with pain.  Stay well-hydrated and drink plenty of fluids.  Return to the ER if you have fevers, drooling, changes in your voice, swelling to one side of your neck or throat.  Your lab work-up, chest x-ray were good today.  Your lightheadedness may be from your heart skipping a beat.  Nothing emergent needs to be done about this, but recommend follow up with cardiology.  Call your primary care doctor for referral to cardiology.  Return to the ER for exertional chest pain or shortness of breath, pain in your chest with breathing, fevers, cough, palpitations.

## 2017-10-29 ENCOUNTER — Ambulatory Visit: Payer: Medicare Other | Admitting: Internal Medicine

## 2018-02-19 ENCOUNTER — Other Ambulatory Visit: Payer: Self-pay

## 2018-02-19 ENCOUNTER — Encounter (HOSPITAL_COMMUNITY): Payer: Self-pay | Admitting: Emergency Medicine

## 2018-02-19 ENCOUNTER — Emergency Department (HOSPITAL_COMMUNITY)
Admission: EM | Admit: 2018-02-19 | Discharge: 2018-02-19 | Disposition: A | Discharge status: Home / Self Care | Payer: Medicare Other | Attending: Emergency Medicine | Admitting: Emergency Medicine

## 2018-02-19 DIAGNOSIS — R300 Dysuria: Secondary | ICD-10-CM | POA: Insufficient documentation

## 2018-02-19 DIAGNOSIS — R339 Retention of urine, unspecified: Secondary | ICD-10-CM | POA: Diagnosis present

## 2018-02-19 DIAGNOSIS — R109 Unspecified abdominal pain: Secondary | ICD-10-CM | POA: Insufficient documentation

## 2018-02-19 DIAGNOSIS — F17200 Nicotine dependence, unspecified, uncomplicated: Secondary | ICD-10-CM | POA: Diagnosis not present

## 2018-02-19 DIAGNOSIS — R350 Frequency of micturition: Secondary | ICD-10-CM | POA: Diagnosis not present

## 2018-02-19 DIAGNOSIS — F1210 Cannabis abuse, uncomplicated: Secondary | ICD-10-CM | POA: Diagnosis not present

## 2018-02-19 DIAGNOSIS — I10 Essential (primary) hypertension: Secondary | ICD-10-CM | POA: Diagnosis not present

## 2018-02-19 DIAGNOSIS — N390 Urinary tract infection, site not specified: Secondary | ICD-10-CM | POA: Diagnosis not present

## 2018-02-19 LAB — URINALYSIS, ROUTINE W REFLEX MICROSCOPIC
GLUCOSE, UA: NEGATIVE mg/dL
Ketones, ur: 15 mg/dL — AB
NITRITE: NEGATIVE
Specific Gravity, Urine: 1.025 (ref 1.005–1.030)
pH: 6 (ref 5.0–8.0)

## 2018-02-19 LAB — CBC WITH DIFFERENTIAL/PLATELET
ABS IMMATURE GRANULOCYTES: 0.04 10*3/uL (ref 0.00–0.07)
BASOS PCT: 0 %
Basophils Absolute: 0 10*3/uL (ref 0.0–0.1)
EOS ABS: 0 10*3/uL (ref 0.0–0.5)
Eosinophils Relative: 0 %
HCT: 36.2 % — ABNORMAL LOW (ref 39.0–52.0)
Hemoglobin: 10.9 g/dL — ABNORMAL LOW (ref 13.0–17.0)
IMMATURE GRANULOCYTES: 0 %
Lymphocytes Relative: 22 %
Lymphs Abs: 2.1 10*3/uL (ref 0.7–4.0)
MCH: 23.3 pg — ABNORMAL LOW (ref 26.0–34.0)
MCHC: 30.1 g/dL (ref 30.0–36.0)
MCV: 77.5 fL — ABNORMAL LOW (ref 80.0–100.0)
Monocytes Absolute: 1 10*3/uL (ref 0.1–1.0)
Monocytes Relative: 10 %
NEUTROS ABS: 6.2 10*3/uL (ref 1.7–7.7)
NEUTROS PCT: 68 %
NRBC: 0 % (ref 0.0–0.2)
Platelets: 411 10*3/uL — ABNORMAL HIGH (ref 150–400)
RBC: 4.67 MIL/uL (ref 4.22–5.81)
RDW: 18.6 % — AB (ref 11.5–15.5)
WBC: 9.3 10*3/uL (ref 4.0–10.5)

## 2018-02-19 LAB — URINALYSIS, MICROSCOPIC (REFLEX): WBC, UA: >50 WBC/hpf (ref 0–5)

## 2018-02-19 LAB — BASIC METABOLIC PANEL
ANION GAP: 6 (ref 5–15)
BUN: 15 mg/dL (ref 8–23)
CO2: 28 mmol/L (ref 22–32)
Calcium: 9.4 mg/dL (ref 8.9–10.3)
Chloride: 104 mmol/L (ref 98–111)
Creatinine, Ser: 1.35 mg/dL — ABNORMAL HIGH (ref 0.61–1.24)
GFR calc non Af Amer: 50 mL/min — ABNORMAL LOW (ref >60)
GFR, EST AFRICAN AMERICAN: 59 mL/min — AB (ref >60)
Glucose, Bld: 107 mg/dL — ABNORMAL HIGH (ref 70–99)
POTASSIUM: 4.2 mmol/L (ref 3.5–5.1)
SODIUM: 138 mmol/L (ref 135–145)

## 2018-02-19 MED ORDER — CEPHALEXIN 250 MG PO CAPS: 250.0000 mg | ORAL_CAPSULE | Freq: Four times a day (QID) | ORAL | 0 refills | Status: AC

## 2018-02-19 MED ORDER — SODIUM CHLORIDE 0.9 % IV BOLUS
1000.0000 mL | Freq: Once | INTRAVENOUS | Status: AC
  Administered 2018-02-19: 1000 mL via INTRAVENOUS

## 2018-02-19 MED ORDER — SODIUM CHLORIDE 0.9 % IV SOLN
1.0000 g | Freq: Once | INTRAVENOUS | Status: AC
  Administered 2018-02-19: 1 g via INTRAVENOUS
  Filled 2018-02-19: qty 10

## 2018-02-19 NOTE — ED Triage Notes (Signed)
Pt reports starting Tuesday this week Pt had dysuria and difficulty voiding.

## 2018-02-19 NOTE — Discharge Instructions (Signed)
You were evaluated in the emergency department for difficulty passing her urine.  You had blood work and a urinalysis and were found to have a urinary tract infection.  You received some intravenous antibiotics here and we are sending you home with a prescription for 7 days of antibiotics.  It will be important for you to stay well-hydrated.  Please follow-up with your doctor or return if any worsening symptoms.

## 2018-02-19 NOTE — ED Triage Notes (Signed)
PT reports he takes HTN meds but did not take his meds this AM. BP elevated on arrival to room.

## 2018-02-19 NOTE — ED Triage Notes (Signed)
Pt attempting to void at this time . Plan for bladder scan for post residual.

## 2018-02-19 NOTE — ED Provider Notes (Signed)
[facility] EMERGENCY DEPARTMENT Provider Note   CSN: [id number] Arrival date & time: 02/19/18  0705     History   Chief Complaint Chief Complaint  Patient presents with  . Urinary Retention    HPI Max Cross is a 72 y.o. male.  He has a history of hypertension.  He is complaining of 4 to 5 days of difficulty passing urine.  Is associate with some low abdominal discomfort and pain in his back.  No fevers no chills no nausea no vomiting.  He tried to get an over-the-counter prostate medication at the pharmacy that did not help with symptoms.  He states he has had this before he needed a surgical procedure and he did have a catheter one point.  No recent medications no injuries.  The history is provided by the patient.  Male GU Problem  Primary symptoms include dysuria. This is a new problem. Episode onset: 4 days. The problem occurs constantly. The problem has not changed since onset.The symptoms occur during urination and after urination. Associated symptoms include abdominal pain and frequency. Pertinent negatives include no nausea, no vomiting, no constipation and no diarrhea. There has been no fever. He has tried nothing for the symptoms. The treatment provided no relief. Sexual activity: non-contributory.    Past Medical History:  Diagnosis Date  . Hypertension     There are no active problems to display for this patient.   History reviewed. No pertinent surgical history.      Home Medications    Prior to Admission medications   Medication Sig Start Date End Date Taking? Authorizing Provider  hydrochlorothiazide (HYDRODIURIL) 25 MG tablet Take 1 tablet (25 mg total) by mouth daily. 12/21/15   Audry Pili, PA-C  hydrOXYzine (ATARAX/VISTARIL) 25 MG tablet Take 2 tablets (50 mg total) by mouth every 6 (six) hours as needed. 01/07/17   Arby Barrette, MD  lidocaine (XYLOCAINE) 2 % solution Use as directed 15 mLs in the mouth or throat as needed for mouth  pain (throat pain). 09/01/17   Liberty Handy, PA-C  predniSONE (DELTASONE) 20 MG tablet 3 Tabs PO Days 1-3, then 2 tabs PO Days 4-6, then 1 tab PO Day 7-9, then Half Tab PO Day 10-12 08/30/16   Renne Crigler, PA-C  predniSONE (STERAPRED UNI-PAK 21 TAB) 10 MG (21) TBPK tablet Take by mouth daily. Take 6 tabs by mouth daily  for 2 days, then 5 tabs for 2 days, then 4 tabs for 2 days, then 3 tabs for 2 days, 2 tabs for 2 days, then 1 tab by mouth daily for 2 days 09/20/16   Lorre Nick, MD    Family History History reviewed. No pertinent family history.  Social History Social History   Tobacco Use  . Smoking status: Current Some Day Smoker  . Smokeless tobacco: Never Used  Substance Use Topics  . Alcohol use: No  . Drug use: Yes    Types: Marijuana     Allergies   Atorvastatin   Review of Systems Review of Systems  Constitutional: Negative for fever.  HENT: Negative for sore throat.   Eyes: Negative for visual disturbance.  Respiratory: Negative for shortness of breath.   Cardiovascular: Negative for chest pain.  Gastrointestinal: Positive for abdominal pain. Negative for constipation, diarrhea, nausea and vomiting.  Genitourinary: Positive for decreased urine volume, difficulty urinating, dysuria and frequency. Negative for discharge, genital sores, hematuria and testicular pain.  Musculoskeletal: Positive for back pain. Negative for neck pain.  Skin: Negative for rash.  Neurological: Negative for headaches.     Physical Exam Updated Vital Signs BP (!) 151/83   Pulse 92   Temp 98.3 F (36.8 C) (Oral)   Resp 18   Ht 5\' 4"  (1.626 m)   Wt 68 kg   SpO2 95%   BMI 25.75 kg/m   Physical Exam  Constitutional: He appears well-developed and well-nourished.  HENT:  Head: Normocephalic and atraumatic.  Eyes: Conjunctivae are normal.  Neck: Neck supple.  Cardiovascular: Normal rate and intact distal pulses.  No murmur heard. Pulmonary/Chest: Effort normal and  breath sounds normal. No respiratory distress.  Abdominal: Soft. He exhibits no mass. There is tenderness (suprapubic - mild). There is no guarding.  Musculoskeletal: He exhibits no edema or deformity.  Neurological: He is alert.  Skin: Skin is warm and dry.  Psychiatric: He has a normal mood and affect.  Nursing note and vitals reviewed.    ED Treatments / Results  Labs (all labs ordered are listed, but only abnormal results are displayed) Labs Reviewed  URINALYSIS, ROUTINE W REFLEX MICROSCOPIC - Abnormal; Notable for the following components:      Result Value   APPearance CLOUDY (*)    Hgb urine dipstick LARGE (*)    Bilirubin Urine SMALL (*)    Ketones, ur 15 (*)    Protein, ur >300 (*)    Leukocytes, UA LARGE (*)    All other components within normal limits  BASIC METABOLIC PANEL - Abnormal; Notable for the following components:   Glucose, Bld 107 (*)    Creatinine, Ser 1.35 (*)    GFR calc non Af Amer 50 (*)    GFR calc Af Amer 59 (*)    All other components within normal limits  CBC WITH DIFFERENTIAL/PLATELET - Abnormal; Notable for the following components:   Hemoglobin 10.9 (*)    HCT 36.2 (*)    MCV 77.5 (*)    MCH 23.3 (*)    RDW 18.6 (*)    Platelets 411 (*)    All other components within normal limits  URINALYSIS, MICROSCOPIC (REFLEX) - Abnormal; Notable for the following components:   Bacteria, UA FEW (*)    Non Squamous Epithelial PRESENT (*)    All other components within normal limits  URINE CULTURE    EKG None  Radiology No results found.  Procedures Procedures (including critical care time)  Medications Ordered in ED Medications  sodium chloride 0.9 % bolus 1,000 mL (0 mLs Intravenous Stopped 02/19/18 1036)  cefTRIAXone (ROCEPHIN) 1 g in sodium chloride 0.9 % 100 mL IVPB (0 g Intravenous Stopped 02/19/18 1036)     Initial Impression / Assessment and Plan / ED Course  I have reviewed the triage vital signs and the nursing  notes.  Pertinent labs & imaging results that were available during my care of the patient were reviewed by me and considered in my medical decision making (see chart for details).  Clinical Course as of Feb 19 1538  Sat Feb 19, 2018  1610 Patient was able to urinate about 30 cc.  He was bladder scanned and only had about 30 cc left in his bladder.  Have ordered some IV fluids and pending blood work.   [MB]  0920 Patient's lab work was fairly unremarkable.  His hemoglobin is low but close to his baseline.  His renal function is also slightly elevated but baseline.  Urinalysis likely consistent with infection with greater than 50 white cells.  I have ordered him IV fluids and ceftriaxone.  Patient otherwise nontoxic-appearing and likely can be safely discharged on oral antibiotics.  Culture is been sent.   [MB]    Clinical Course User Index [MB] Terrilee Files, MD      Final Clinical Impressions(s) / ED Diagnoses   Final diagnoses:  Lower urinary tract infectious disease  Dysuria    ED Discharge Orders         Ordered    cephALEXin (KEFLEX) 250 MG capsule  4 times daily     02/19/18 0954           Terrilee Files, MD 02/19/18 1539

## 2018-02-21 LAB — URINE CULTURE
Culture: >=100000 — AB
Special Requests: NORMAL

## 2018-02-22 ENCOUNTER — Telehealth: Payer: Self-pay | Admitting: Emergency Medicine

## 2018-02-22 NOTE — Telephone Encounter (Signed)
Post ED Visit - Positive Culture Follow-up  Culture report reviewed by antimicrobial stewardship pharmacist:  []  Max Cross, Pharm.D. []  Max Cross, Pharm.D., BCPS AQ-ID []  Max Cross, Pharm.D., BCPS []  Max Cross, Pharm.D., BCPS []  Max Cross, [address]Pharm[address]D., BCPS, AAHIVP []  Max Cross, Pharm.D., BCPS, AAHIVP []  Max Cross, PharmD, BCPS []  Max Cross, PharmD, BCPS []  Max Cross, PharmD, BCPS []  Max Cross, PharmD [facility] VailJosh Cross PharmD  Positive urine culture Treated with cephalexin, organism sensitive to the same and no further patient follow-up is required at this time.  Max Cross, Max Cross 02/22/2018, 10:44 AM

## 2018-11-24 IMAGING — DX DG CHEST 2V
2 series · 2 of 2 positions shown · non-contrast
Comparison: None.

CLINICAL DATA: Cough and chest pain for 1 week

EXAM:
CHEST - 2 VIEW

[chest lat]
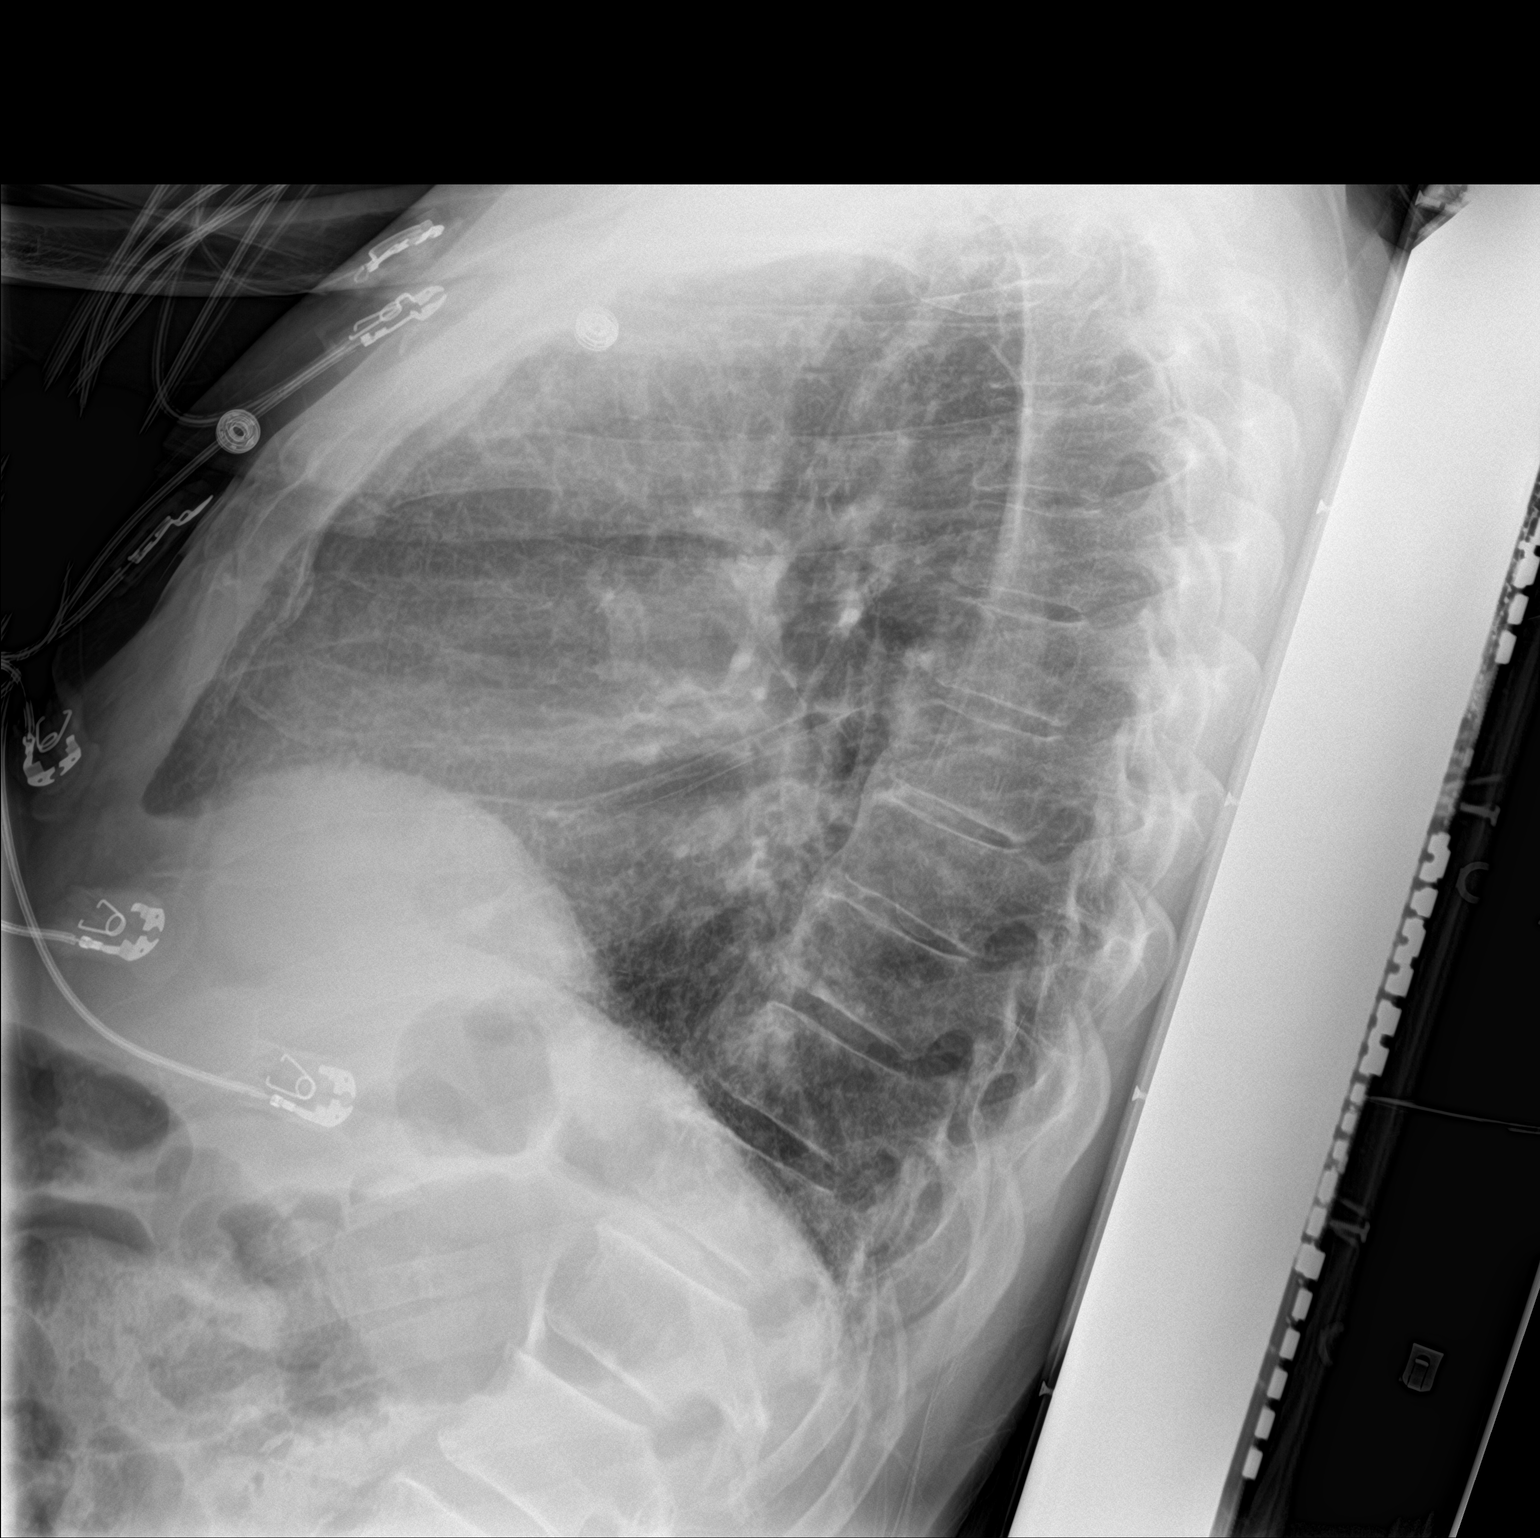

[chest ap]
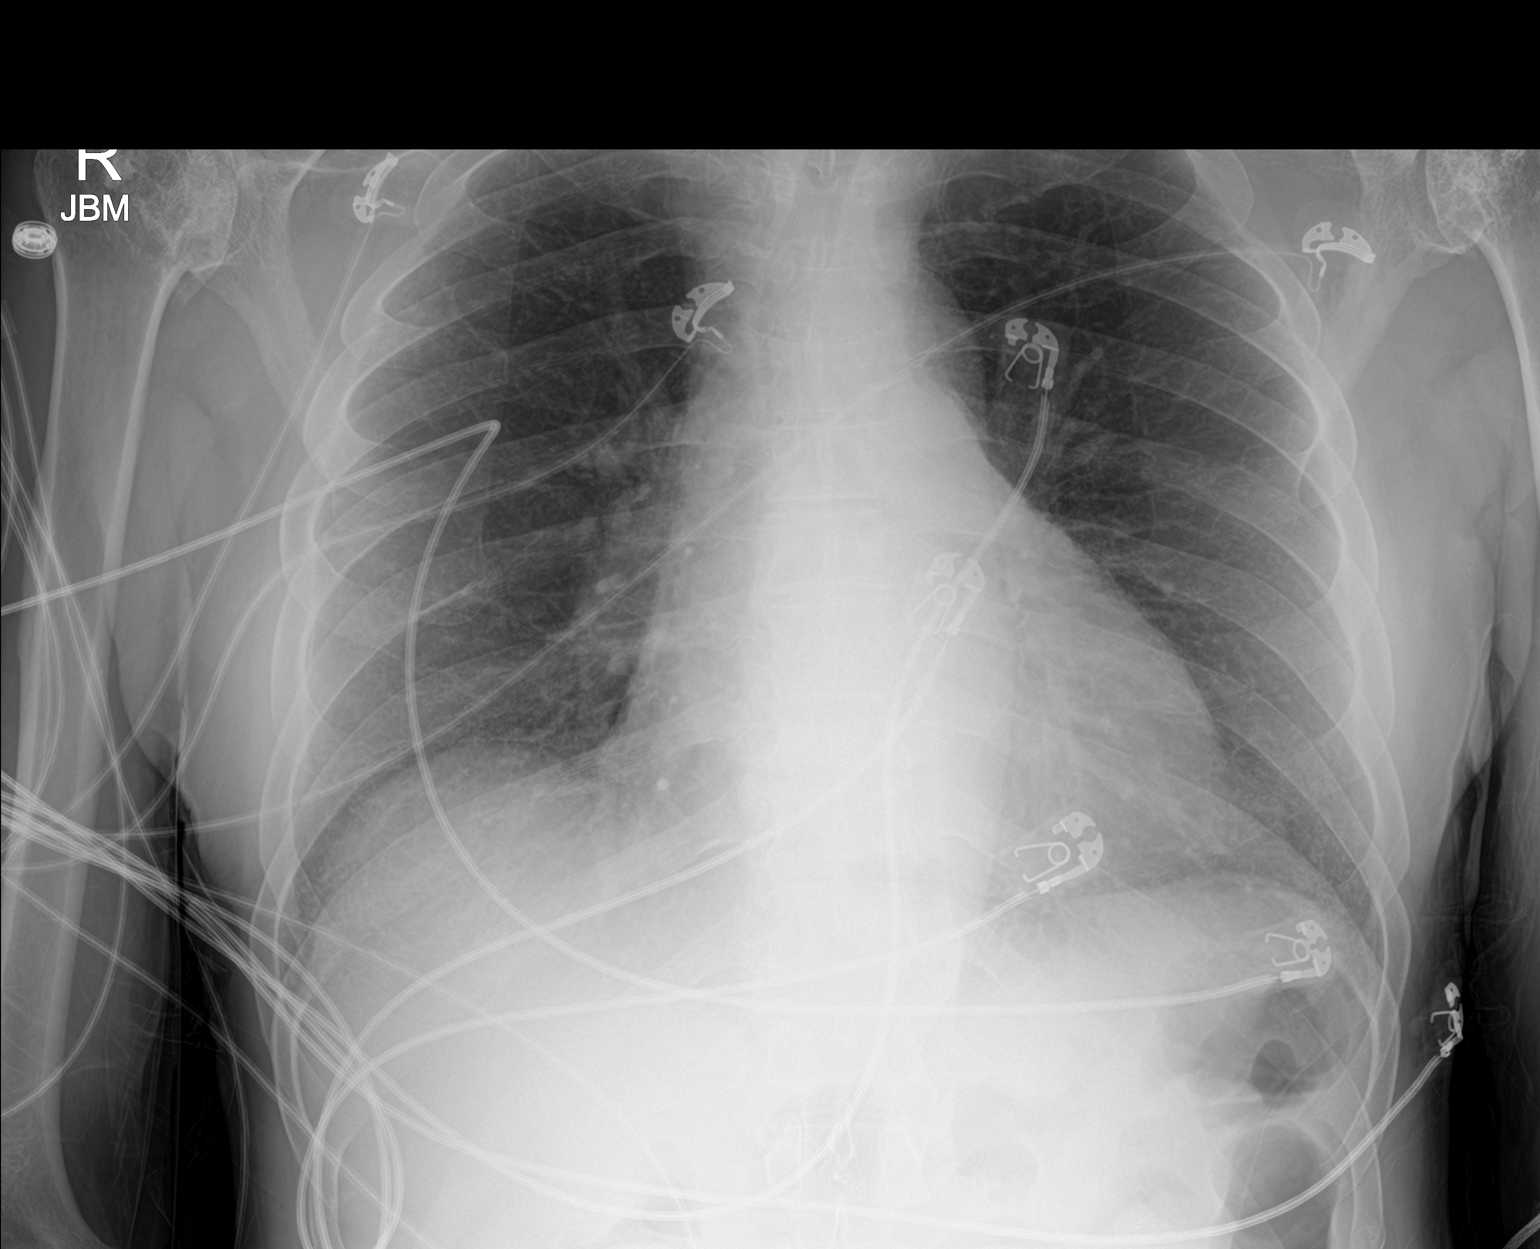

[2 of 2 positions shown; findings below may reference images not displayed]

FINDINGS: Cardiac shadow is within normal limits. The lungs are well aerated
bilaterally. No focal infiltrate or effusion is seen. No acute bony
abnormality is noted.
IMPRESSION: No active cardiopulmonary disease.

## 2021-04-13 DEATH — deceased
